# Patient Record
Sex: Female | Born: 1945 | Race: White | Hispanic: No | Smoking: Never smoker
Health system: Southern US, Community
[De-identification: ages and names within clinical notes are randomized; demographics above are authoritative.]

## PROBLEM LIST (undated history)

## (undated) DIAGNOSIS — N189 Chronic kidney disease, unspecified: Secondary | ICD-10-CM

## (undated) DIAGNOSIS — I10 Essential (primary) hypertension: Secondary | ICD-10-CM

## (undated) DIAGNOSIS — E785 Hyperlipidemia, unspecified: Secondary | ICD-10-CM

## (undated) HISTORY — DX: Chronic kidney disease, unspecified: N18.9

## (undated) HISTORY — DX: Hyperlipidemia, unspecified: E78.5

## (undated) HISTORY — DX: Essential (primary) hypertension: I10

---
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
## 1998-05-25 HISTORY — PX: CORONARY ARTERY BYPASS GRAFT: SHX141

## 2015-05-31 ENCOUNTER — Other Ambulatory Visit: Payer: Self-pay | Admitting: *Deleted

## 2015-05-31 ENCOUNTER — Encounter: Payer: Self-pay | Admitting: Vascular Surgery

## 2015-05-31 DIAGNOSIS — Z01810 Encounter for preprocedural cardiovascular examination: Secondary | ICD-10-CM

## 2015-05-31 DIAGNOSIS — N186 End stage renal disease: Secondary | ICD-10-CM

## 2015-06-21 ENCOUNTER — Ambulatory Visit: Payer: Self-pay | Admitting: Vascular Surgery

## 2015-06-21 ENCOUNTER — Other Ambulatory Visit (HOSPITAL_COMMUNITY): Payer: Self-pay

## 2015-06-21 ENCOUNTER — Encounter (HOSPITAL_COMMUNITY): Payer: Self-pay

## 2015-07-29 ENCOUNTER — Encounter: Payer: Self-pay | Admitting: Vascular Surgery

## 2015-08-02 ENCOUNTER — Encounter (HOSPITAL_COMMUNITY): Payer: Self-pay

## 2015-08-02 ENCOUNTER — Ambulatory Visit: Payer: Self-pay | Admitting: Vascular Surgery

## 2015-08-02 ENCOUNTER — Other Ambulatory Visit (HOSPITAL_COMMUNITY): Payer: Self-pay

## 2015-08-23 ENCOUNTER — Encounter: Payer: Self-pay | Admitting: Vascular Surgery

## 2015-08-26 ENCOUNTER — Other Ambulatory Visit (HOSPITAL_COMMUNITY): Payer: Self-pay

## 2015-08-26 ENCOUNTER — Inpatient Hospital Stay (HOSPITAL_COMMUNITY)
Admission: RE | Admit: 2015-08-26 | Payer: Self-pay | Source: Ambulatory Visit | Attending: Vascular Surgery | Admitting: Vascular Surgery

## 2015-08-30 ENCOUNTER — Ambulatory Visit: Payer: Self-pay | Admitting: Vascular Surgery

## 2015-09-17 ENCOUNTER — Encounter: Payer: Self-pay | Admitting: Vascular Surgery

## 2015-09-20 ENCOUNTER — Encounter: Payer: Self-pay | Admitting: Vascular Surgery

## 2015-09-20 ENCOUNTER — Ambulatory Visit (INDEPENDENT_AMBULATORY_CARE_PROVIDER_SITE_OTHER): Payer: 59 | Admitting: Vascular Surgery

## 2015-09-20 ENCOUNTER — Ambulatory Visit (HOSPITAL_COMMUNITY)
Admission: RE | Admit: 2015-09-20 | Discharge: 2015-09-20 | Disposition: A | Discharge status: Home / Self Care | Payer: Self-pay | Source: Ambulatory Visit | Attending: Vascular Surgery | Admitting: Vascular Surgery

## 2015-09-20 VITALS — BP 101/67 | HR 82 | Temp 97.4°F | Resp 20 | Ht 65.0 in | Wt 148.3 lb

## 2015-09-20 DIAGNOSIS — Z01810 Encounter for preprocedural cardiovascular examination: Secondary | ICD-10-CM

## 2015-09-20 DIAGNOSIS — Z01818 Encounter for other preprocedural examination: Secondary | ICD-10-CM | POA: Insufficient documentation

## 2015-09-20 DIAGNOSIS — Z992 Dependence on renal dialysis: Secondary | ICD-10-CM | POA: Diagnosis not present

## 2015-09-20 DIAGNOSIS — N186 End stage renal disease: Secondary | ICD-10-CM | POA: Diagnosis not present

## 2015-09-20 NOTE — Progress Notes (Signed)
Referred by:  Dr. Eliott Nineunham (Nephrology)  Reason for referral: permanent access   History of Present Illness  Kathryn Wilson is a 70 y.o. (1945-11-05) female who presents with chief complaint: bilateral leg "clots".  This patient was sent here for permanent access placement.  The patient REFUSES PERMANENT ACCESS placement, and would not continue the conversation.  Patient notes, worsening of swelling few weeks, associated with no obvious trigger.  She has had a longstanding history of leg swelling.  The patient's symptoms include: swelling and aching in legs.  The patient has had no history of DVT, no history of pregnancy, known history of varicose vein, no history of venous stasis ulcers, no history of  Lymphedema and no history of skin changes in lower legs.  There is no family history of venous disorders.  The patient has used compression stockings in the past but not consistently.   Past Medical History  Diagnosis Date  . Chronic kidney disease   . Hypertension   . Hyperlipidemia     Past Surgical History  Procedure Laterality Date  . Coronary artery bypass graft  2000    three vessel BP   . Tonsillectomy and adenoidectomy  1967    Social History   Social History  . Marital Status: Divorced    Spouse Name: N/A  . Number of Children: N/A  . Years of Education: N/A   Occupational History  . Not on file.   Social History Main Topics  . Smoking status: Never Smoker   . Smokeless tobacco: Not on file  . Alcohol Use: Not on file  . Drug Use: Not on file  . Sexual Activity: Not on file   Other Topics Concern  . Not on file   Social History Narrative    Family History  Problem Relation Age of Onset  . Heart attack Mother   . Kidney disease Father   . Heart attack Father     Current Outpatient Prescriptions  Medication Sig Dispense Refill  . amLODipine (NORVASC) 5 MG tablet Take 5 mg by mouth daily.    Marland Kitchen. aspirin 81 MG tablet Take 81 mg by mouth daily.    Marland Kitchen.  atorvastatin (LIPITOR) 40 MG tablet Take 40 mg by mouth daily.    Marland Kitchen. b complex-vitamin c-folic acid (NEPHRO-VITE) 0.8 MG TABS tablet Take 1 tablet by mouth at bedtime.    . carvedilol (COREG) 12.5 MG tablet Take 12.5 mg by mouth 2 (two) times daily with a meal.    . cinacalcet (SENSIPAR) 60 MG tablet Take 60 mg by mouth daily.    . folic acid-vitamin b complex-vitamin c-selenium-zinc (DIALYVITE) 3 MG TABS tablet Take 1 tablet by mouth daily.    . isosorbide mononitrate (IMDUR) 30 MG 24 hr tablet Take 30 mg by mouth daily.    Marland Kitchen. levothyroxine (SYNTHROID, LEVOTHROID) 88 MCG tablet Take 88 mcg by mouth daily before breakfast.    . lisinopril (PRINIVIL,ZESTRIL) 10 MG tablet Take 10 mg by mouth 2 (two) times daily.    Marland Kitchen. loperamide (IMODIUM) 2 MG capsule Take by mouth 4 (four) times daily as needed for diarrhea or loose stools.    . nitroGLYCERIN (NITROSTAT) 0.4 MG SL tablet Place 0.4 mg under the tongue every 5 (five) minutes as needed for chest pain.    Marland Kitchen. ondansetron (ZOFRAN) 4 MG tablet Take 4 mg by mouth every 8 (eight) hours as needed for nausea or vomiting. Take two (2) tablets every eight hours for nausea per notes  and medication lists from Dr Elza Rafter office.    . sevelamer carbonate (RENVELA) 800 MG tablet Take 800 mg by mouth 3 (three) times daily with meals. Take 3 (three) tablets three times a day per office notes from Dr Elza Rafter office     No current facility-administered medications for this visit.    Allergies  Allergen Reactions  . Calcium Acetate (Phos Binder) Other (See Comments)    Altered mental status  . Iodine   . Phenergan [Promethazine Hcl]   . Sulfa Antibiotics   . Tricor [Fenofibrate]      REVIEW OF SYSTEMS:  (Positives checked otherwise negative)  CARDIOVASCULAR:    chest pain,   chest pressure,   palpitations,   shortness of breath when laying flat,   shortness of breath with exertion,    pain in feet when walking,   pain in feet when  laying flat,  history of blood clot in veins (DVT),   history of phlebitis,   swelling in legs,   varicose veins  PULMONARY:    productive cough,   asthma,   wheezing  NEUROLOGIC:    weakness in arms or legs,   numbness in arms or legs,   difficulty speaking or slurred speech,   temporary loss of vision in one eye,   dizziness  HEMATOLOGIC:    bleeding problems,   problems with blood clotting too easily  MUSCULOSKEL:    joint pain,  joint swelling  GASTROINTEST:    vomiting blood,   blood in stool     GENITOURINARY:    burning with urination,   blood in urine  PSYCHIATRIC:    history of major depression  INTEGUMENTARY:    rashes,   ulcers  CONSTITUTIONAL:    fever,   chills   Physical Examination  Filed Vitals:   09/20/15 1047  BP: 101/67  Pulse: 82  Temp: 97.4 F (36.3 C)  TempSrc: Oral  Resp: 20  Height:  (1.651 m)  Weight: 148 lb 4.8 oz (67.268 kg)   Body mass index is 24.68 kg/(m^2).  General: A&O x 3, WDWN  Head: [place]/AT  Ear/Nose/Throat: Hearing grossly intact, nares without erythema or drainage, oropharynx without Erythema/Exudate, Mallampati score: 3  Eyes: PERRLA, EOMI  Neck: Supple, no nuchal rigidity, no palpable LAD  Pulmonary: Sym exp, good air movt, CTAB, no rales, rhonchi, & wheezing  Cardiac: RRR, Nl S1, S2, no Murmurs, rubs or gallops  Vascular: Vessel Right Left  Radial Palpable Palpable  Brachial Palpable Palpable  Carotid Palpable, without bruit Palpable, without bruit  Aorta Not palpable N/A  Femoral Palpable Palpable  Popliteal Not palpable Not palpable  PT Not Palpable Not Palpable  DP Faintly Palpable Faintly Palpable   Gastrointestinal: soft, NTND, no G/R, no HSM, no masses, no CVAT B  Musculoskeletal: M/S 5/5 throughout , Extremities without ischemic changes, B mild LDS, 1+ edema, palpable varicosities  Neurologic: CN 2-12  intact , Pain and light touch intact in extremities , Motor exam as listed above  Psychiatric: Judgment intact, Mood & affect appropriate for pt's clinical situation  Dermatologic: See M/S exam for extremity exam, no rashes otherwise noted  Lymph : No Cervical, Axillary, or Inguinal lymphadenopathy    Medical Decision Making  Kathryn Wilson is a 70 y.o. female who presents with: ESRD on HD,  BLE chronic venous insufficiency (C2), varicose veins with complications   I recommend having the patient come back for BLE venous reflux duplex.  Will discuss further the options once testing results are available.  In regards to her permanent access, pt can be referred again if she will consider such.  Thank you for allowing [place] to participate in this patient's care.   Leonides Sake, MD [facility] Office: [phone] Pager: [phone]  09/20/2015, 12:13 PM

## 2015-09-24 ENCOUNTER — Other Ambulatory Visit: Payer: Self-pay | Admitting: *Deleted

## 2015-09-24 DIAGNOSIS — I83893 Varicose veins of bilateral lower extremities with other complications: Secondary | ICD-10-CM

## 2015-10-04 ENCOUNTER — Encounter: Payer: Self-pay | Admitting: Vascular Surgery

## 2015-10-11 ENCOUNTER — Encounter: Payer: Self-pay | Admitting: Vascular Surgery

## 2015-10-11 ENCOUNTER — Ambulatory Visit (INDEPENDENT_AMBULATORY_CARE_PROVIDER_SITE_OTHER): Payer: 59 | Admitting: Vascular Surgery

## 2015-10-11 ENCOUNTER — Ambulatory Visit (HOSPITAL_COMMUNITY)
Admission: RE | Admit: 2015-10-11 | Discharge: 2015-10-11 | Disposition: A | Discharge status: Home / Self Care | Payer: Medicare Other | Source: Ambulatory Visit | Attending: Vascular Surgery | Admitting: Vascular Surgery

## 2015-10-11 VITALS — BP 115/66 | HR 57 | Ht 65.0 in | Wt 151.1 lb

## 2015-10-11 DIAGNOSIS — M7989 Other specified soft tissue disorders: Secondary | ICD-10-CM | POA: Diagnosis not present

## 2015-10-11 DIAGNOSIS — I83893 Varicose veins of bilateral lower extremities with other complications: Secondary | ICD-10-CM

## 2015-10-11 DIAGNOSIS — I83899 Varicose veins of unspecified lower extremities with other complications: Secondary | ICD-10-CM | POA: Insufficient documentation

## 2015-10-11 NOTE — Progress Notes (Signed)
Established Venous Insufficiency   History of Present Illness  Kathryn Wilson is a 70 y.o. (15-Jan-1946) female who presents with chief complaint: follow venous studies.  The patient's symptoms have not progressed.  The patient's symptoms are: bilateral leg swelling and aching in legs.  The patient is not using compression stockings.  The patient's PMH, PSH, SH, and FamHx are unchanged from 09/20/15.  Current Outpatient Prescriptions  Medication Sig Dispense Refill  . amLODipine (NORVASC) 5 MG tablet Take 5 mg by mouth daily.    Marland Kitchen aspirin 81 MG tablet Take 81 mg by mouth daily.    Marland Kitchen atorvastatin (LIPITOR) 40 MG tablet Take 40 mg by mouth daily.    Marland Kitchen b complex-vitamin c-folic acid (NEPHRO-VITE) 0.8 MG TABS tablet Take 1 tablet by mouth at bedtime.    . carvedilol (COREG) 12.5 MG tablet Take 12.5 mg by mouth 2 (two) times daily with a meal.    . cinacalcet (SENSIPAR) 60 MG tablet Take 60 mg by mouth daily.    . folic acid-vitamin b complex-vitamin c-selenium-zinc (DIALYVITE) 3 MG TABS tablet Take 1 tablet by mouth daily.    . isosorbide mononitrate (IMDUR) 30 MG 24 hr tablet Take 30 mg by mouth daily.    Marland Kitchen levothyroxine (SYNTHROID, LEVOTHROID) 88 MCG tablet Take 88 mcg by mouth daily before breakfast.    . lisinopril (PRINIVIL,ZESTRIL) 10 MG tablet Take 10 mg by mouth 2 (two) times daily.    Marland Kitchen loperamide (IMODIUM) 2 MG capsule Take by mouth 4 (four) times daily as needed for diarrhea or loose stools.    . nitroGLYCERIN (NITROSTAT) 0.4 MG SL tablet Place 0.4 mg under the tongue every 5 (five) minutes as needed for chest pain.    Marland Kitchen ondansetron (ZOFRAN) 4 MG tablet Take 4 mg by mouth every 8 (eight) hours as needed for nausea or vomiting. Take two (2) tablets every eight hours for nausea per notes and medication lists from Dr Elza Rafter office.    . sevelamer carbonate (RENVELA) 800 MG tablet Take 800 mg by mouth 3 (three) times daily with meals. Take 3 (three) tablets three times a day per office  notes from Dr Elza Rafter office     No current facility-administered medications for this visit.    Allergies  Allergen Reactions  . Calcium Acetate (Phos Binder) Other (See Comments)    Altered mental status  . Iodine   . Phenergan [Promethazine Hcl]   . Sulfa Antibiotics   . Tricor [Fenofibrate]     On ROS today: bilateral leg swelling, no recent change in medications    Physical Examination  Filed Vitals:   10/11/15 0937  BP: 115/66  Pulse: 57  Height:  (1.651 m)  Weight: 151 lb 1.6 oz (68.539 kg)  SpO2: 99%   Body mass index is 25.14 kg/(m^2).  General: A&O x 3, WDWN  Pulmonary: Sym exp, good air movt, CTAB, no rales, rhonchi, & wheezing  Cardiac: RRR, Nl S1, S2, no Murmurs, rubs or gallops  Vascular: Vessel Right Left  Radial Palpable Palpable  Brachial Palpable Palpable  Carotid Palpable, without bruit Palpable, without bruit  Aorta Not palpable N/A  Femoral Palpable Palpable  Popliteal Not palpable Not palpable  PT Not Palpable Not Palpable  DP Faintly Palpable Faintly Palpable   Gastrointestinal: soft, NTND, no G/R, no HSM, no masses, no CVAT B  Musculoskeletal: M/S 5/5 throughout , Extremities without ischemic changes, B mild LDS, 1+ edema, palpable varicosities  Neurologic: CN 2-12 intact ,  Pain and light touch intact in extremities , Motor exam as listed above   Non-Invasive Vascular Imaging  BLE Venous Insufficiency Duplex (Date: 10/11/2015):   RLE:   no DVT and SVT,   no GSV reflux,   no SSV reflux,  no deep venous reflux  LLE:  no DVT and SVT,   no GSV reflux,   no SSV reflux,  no deep venous reflux   Medical Decision Making  Kathryn Wilson is a 70 y.o. female who presents with: varicose veins, bilateral leg swelling of unknown etiology    While the patient has varicosities, her reflux studies was negative, suggesting intact venous valves.  Further evaluation for a systemic etiology of her  leg swelling is recommended.  Thank you for allowing us to participate in this patient's care.   Leonides SakeBrian Aaro Meyers, MD [facility] Office: [phone] Pager: [phone]  10/11/2015, 9:11 AM

## 2016-05-07 ENCOUNTER — Encounter (HOSPITAL_COMMUNITY): Admission: EM | Disposition: E | Payer: Self-pay | Source: Home / Self Care | Attending: Emergency Medicine

## 2016-05-07 ENCOUNTER — Emergency Department (HOSPITAL_COMMUNITY): Payer: Medicare Other

## 2016-05-07 ENCOUNTER — Emergency Department (HOSPITAL_COMMUNITY)
Admission: EM | Admit: 2016-05-07 | Discharge: 2016-05-25 | Disposition: E | Payer: Medicare Other | Attending: Emergency Medicine | Admitting: Emergency Medicine

## 2016-05-07 ENCOUNTER — Encounter (HOSPITAL_COMMUNITY): Payer: Self-pay | Admitting: *Deleted

## 2016-05-07 DIAGNOSIS — R5383 Other fatigue: Secondary | ICD-10-CM | POA: Diagnosis present

## 2016-05-07 DIAGNOSIS — Z7982 Long term (current) use of aspirin: Secondary | ICD-10-CM | POA: Diagnosis not present

## 2016-05-07 DIAGNOSIS — I120 Hypertensive chronic kidney disease with stage 5 chronic kidney disease or end stage renal disease: Secondary | ICD-10-CM | POA: Diagnosis not present

## 2016-05-07 DIAGNOSIS — R570 Cardiogenic shock: Secondary | ICD-10-CM | POA: Diagnosis not present

## 2016-05-07 DIAGNOSIS — Z951 Presence of aortocoronary bypass graft: Secondary | ICD-10-CM | POA: Insufficient documentation

## 2016-05-07 DIAGNOSIS — Z992 Dependence on renal dialysis: Secondary | ICD-10-CM | POA: Insufficient documentation

## 2016-05-07 DIAGNOSIS — N186 End stage renal disease: Secondary | ICD-10-CM | POA: Insufficient documentation

## 2016-05-07 DIAGNOSIS — Z79899 Other long term (current) drug therapy: Secondary | ICD-10-CM | POA: Insufficient documentation

## 2016-05-07 LAB — COMPREHENSIVE METABOLIC PANEL
ALBUMIN: 2.9 g/dL — AB (ref 3.5–5.0)
ALT: 16 U/L (ref 14–54)
ANION GAP: 15 (ref 5–15)
AST: 93 U/L — ABNORMAL HIGH (ref 15–41)
Alkaline Phosphatase: 57 U/L (ref 38–126)
BILIRUBIN TOTAL: 1.4 mg/dL — AB (ref 0.3–1.2)
BUN: 31 mg/dL — AB (ref 6–20)
CO2: 25 mmol/L (ref 22–32)
Calcium: 8.9 mg/dL (ref 8.9–10.3)
Chloride: 95 mmol/L — ABNORMAL LOW (ref 101–111)
Creatinine, Ser: 6.97 mg/dL — ABNORMAL HIGH (ref 0.44–1.00)
GFR calc Af Amer: 6 mL/min — ABNORMAL LOW (ref >60)
GFR calc non Af Amer: 5 mL/min — ABNORMAL LOW (ref >60)
GLUCOSE: 202 mg/dL — AB (ref 65–99)
POTASSIUM: 4.9 mmol/L (ref 3.5–5.1)
SODIUM: 135 mmol/L (ref 135–145)
Total Protein: 5.6 g/dL — ABNORMAL LOW (ref 6.5–8.1)

## 2016-05-07 LAB — CBC WITH DIFFERENTIAL/PLATELET
BASOS ABS: 0 10*3/uL (ref 0.0–0.1)
Basophils Relative: 0 %
EOS ABS: 0 10*3/uL (ref 0.0–0.7)
Eosinophils Relative: 0 %
HEMATOCRIT: 32.2 % — AB (ref 36.0–46.0)
Hemoglobin: 10.2 g/dL — ABNORMAL LOW (ref 12.0–15.0)
LYMPHS ABS: 0.8 10*3/uL (ref 0.7–4.0)
Lymphocytes Relative: 8 %
MCH: 31.7 pg (ref 26.0–34.0)
MCHC: 31.7 g/dL (ref 30.0–36.0)
MCV: 100 fL (ref 78.0–100.0)
MONOS PCT: 9 %
Monocytes Absolute: 0.9 10*3/uL (ref 0.1–1.0)
Neutro Abs: 8.3 10*3/uL — ABNORMAL HIGH (ref 1.7–7.7)
Neutrophils Relative %: 83 %
Platelets: 177 10*3/uL (ref 150–400)
RBC: 3.22 MIL/uL — AB (ref 3.87–5.11)
RDW: 14.9 % (ref 11.5–15.5)
WBC: 10 10*3/uL (ref 4.0–10.5)

## 2016-05-07 LAB — I-STAT CHEM 8, ED
BUN: 35 mg/dL — AB (ref 6–20)
BUN: 37 mg/dL — ABNORMAL HIGH (ref 6–20)
CALCIUM ION: 0.96 mmol/L — AB (ref 1.15–1.40)
CHLORIDE: 101 mmol/L (ref 101–111)
CHLORIDE: 101 mmol/L (ref 101–111)
CREATININE: 7.1 mg/dL — AB (ref 0.44–1.00)
Calcium, Ion: 1.08 mmol/L — ABNORMAL LOW (ref 1.15–1.40)
Creatinine, Ser: 7.2 mg/dL — ABNORMAL HIGH (ref 0.44–1.00)
GLUCOSE: 190 mg/dL — AB (ref 65–99)
Glucose, Bld: 199 mg/dL — ABNORMAL HIGH (ref 65–99)
HCT: 29 % — ABNORMAL LOW (ref 36.0–46.0)
HCT: 33 % — ABNORMAL LOW (ref 36.0–46.0)
HEMOGLOBIN: 9.9 g/dL — AB (ref 12.0–15.0)
Hemoglobin: 11.2 g/dL — ABNORMAL LOW (ref 12.0–15.0)
POTASSIUM: 4.6 mmol/L (ref 3.5–5.1)
POTASSIUM: 5.2 mmol/L — AB (ref 3.5–5.1)
SODIUM: 134 mmol/L — AB (ref 135–145)
Sodium: 134 mmol/L — ABNORMAL LOW (ref 135–145)
TCO2: 23 mmol/L (ref 0–100)
TCO2: 26 mmol/L (ref 0–100)

## 2016-05-07 LAB — I-STAT TROPONIN, ED: Troponin i, poc: 22.46 ng/mL (ref 0.00–0.08)

## 2016-05-07 LAB — I-STAT CG4 LACTIC ACID, ED: LACTIC ACID, VENOUS: 4.34 mmol/L — AB (ref 0.5–1.9)

## 2016-05-07 SURGERY — INVASIVE LAB ABORTED CASE

## 2016-05-07 MED ORDER — ASPIRIN 300 MG RE SUPP
300.0000 mg | Freq: Once | RECTAL | Status: AC
  Administered 2016-05-07: 300 mg via RECTAL

## 2016-05-07 MED ORDER — SODIUM BICARBONATE 8.4 % IV SOLN
INTRAVENOUS | Status: AC
  Filled 2016-05-07: qty 50

## 2016-05-07 MED ORDER — PHENYLEPHRINE 40 MCG/ML (10ML) SYRINGE FOR IV PUSH (FOR BLOOD PRESSURE SUPPORT)
PREFILLED_SYRINGE | INTRAVENOUS | Status: AC
  Administered 2016-05-07: 200 ug
  Administered 2016-05-07: 100 ug
  Filled 2016-05-07: qty 10

## 2016-05-07 MED ORDER — SODIUM BICARBONATE 8.4 % IV SOLN: 50.0000 meq | Freq: Once | INTRAVENOUS | Status: DC

## 2016-05-07 MED ORDER — CALCIUM GLUCONATE 10 % IV SOLN
INTRAVENOUS | Status: AC
  Administered 2016-05-07: 1000 mg
  Filled 2016-05-07: qty 10

## 2016-05-07 MED ORDER — SODIUM CHLORIDE 0.9 % IV BOLUS (SEPSIS): 1000.0000 mL | Freq: Once | INTRAVENOUS | Status: DC

## 2016-05-07 MED ORDER — SODIUM CHLORIDE 0.9 % IV SOLN
0.0300 [IU]/min | INTRAVENOUS | Status: DC
  Filled 2016-05-07: qty 2

## 2016-05-07 MED ORDER — VANCOMYCIN HCL IN DEXTROSE 1-5 GM/200ML-% IV SOLN
1000.0000 mg | Freq: Once | INTRAVENOUS | Status: AC
  Administered 2016-05-07: 1000 mg via INTRAVENOUS
  Filled 2016-05-07: qty 200

## 2016-05-07 MED ORDER — PIPERACILLIN-TAZOBACTAM 3.375 G IVPB 30 MIN
3.3750 g | Freq: Once | INTRAVENOUS | Status: DC
  Filled 2016-05-07: qty 50

## 2016-05-07 MED ORDER — ETOMIDATE 2 MG/ML IV SOLN
INTRAVENOUS | Status: AC | PRN
  Administered 2016-05-07: 20 mg via INTRAVENOUS

## 2016-05-07 MED ORDER — DEXTROSE 5 % IV SOLN
30.0000 ug/min | INTRAVENOUS | Status: DC
  Administered 2016-05-07: 100 ug/min via INTRAVENOUS
  Filled 2016-05-07: qty 1

## 2016-05-07 MED ORDER — SODIUM CHLORIDE 0.9 % IV BOLUS (SEPSIS): 250.0000 mL | Freq: Once | INTRAVENOUS | Status: DC

## 2016-05-07 MED ORDER — SODIUM CHLORIDE 0.9 % IV BOLUS (SEPSIS)
1000.0000 mL | Freq: Once | INTRAVENOUS | Status: AC
  Administered 2016-05-07: 1000 mL via INTRAVENOUS

## 2016-05-07 MED ORDER — NOREPINEPHRINE BITARTRATE 1 MG/ML IV SOLN
0.0000 ug/min | Freq: Once | INTRAVENOUS | Status: AC
  Administered 2016-05-07: 6 ug/min via INTRAVENOUS
  Filled 2016-05-07: qty 4

## 2016-05-07 MED ORDER — EPINEPHRINE PF 1 MG/10ML IJ SOSY
PREFILLED_SYRINGE | INTRAMUSCULAR | Status: AC | PRN
  Administered 2016-05-07 (×5): 1 mg via INTRAVENOUS

## 2016-05-07 MED ORDER — ROCURONIUM BROMIDE 50 MG/5ML IV SOLN
INTRAVENOUS | Status: AC | PRN
  Administered 2016-05-07: 100 mg via INTRAVENOUS

## 2016-05-07 MED ORDER — SODIUM BICARBONATE 8.4 % IV SOLN
INTRAVENOUS | Status: AC | PRN
  Administered 2016-05-07: 50 meq via INTRAVENOUS

## 2016-05-07 MED FILL — Medication: Qty: 1 | Status: AC

## 2016-05-07 SURGICAL SUPPLY — 9 items
CATH INFINITI 5FR MULTPACK ANG (CATHETERS) IMPLANT
KIT ENCORE 26 ADVANTAGE (KITS) IMPLANT
KIT HEART LEFT (KITS) IMPLANT
PACK CARDIAC CATHETERIZATION (CUSTOM PROCEDURE TRAY) IMPLANT
SHEATH PINNACLE 6F 10CM (SHEATH) IMPLANT
SYR MEDRAD MARK V 150ML (SYRINGE) IMPLANT
TRANSDUCER W/STOPCOCK (MISCELLANEOUS) IMPLANT
TUBING CIL FLEX 10 FLL-RA (TUBING) IMPLANT
WIRE EMERALD 3MM-J .035X150CM (WIRE) IMPLANT

## 2016-05-12 LAB — CULTURE, BLOOD (ROUTINE X 2)
Culture: NO GROWTH
Culture: NO GROWTH

## 2016-05-25 NOTE — Progress Notes (Signed)
Pt was intubated and briefly placed on vent before code was called. RT began to bag pt and ACLS was performed until time of death. RT left ETT in place in case it becomes ME case if not RT will remove when order is given.

## 2016-05-25 NOTE — ED Notes (Signed)
Son at bedside.  Will escort to the family room and call chaplin

## 2016-05-25 NOTE — ED Provider Notes (Signed)
[facility]-EMERGENCY DEPT Provider Note   CSN: [id number] Arrival date & time: 05/22/2016  4098   By signing my name below, I, Clovis Pu, attest that this documentation has been prepared under the direction and in the presence of Shon Baton, MD  Electronically Signed: Clovis Pu, ED Scribe. May 22, 2016. 3:57 AM.   History   Chief Complaint Chief Complaint  Patient presents with  . Fatigue   The history is provided by the patient. No language interpreter was used.   HPI Comments:  Kathryn Wilson is a 71 y.o. female, with a hx of 4 MIs, HTN, hyperlipidemia, and a hernia, who presents to the Emergency Department complaining of persistent fatigue x several days. Pt notes a change in appetite, change in fluid intake, dizziness, weakness, nausea and abdominal pain (only upon palpation). She is on multiple blood pressure medications and states she does not take them as instructed. Pt denies chest pain, SOB, cough, vomiting, diarrhea, any urinary symptoms, suicidal ideations, any other associated symptoms and modifying factors at this time. Pt is a dialysis pt and states she was last dialyzed on 05/05/16.   Past Medical History:  Diagnosis Date  . Chronic kidney disease   . Hyperlipidemia   . Hypertension     Patient Active Problem List   Diagnosis Date Noted  . Leg swelling 10/11/2015  . 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 ESRD on dialysis (HCC) 09/20/2015  . Preop examination 09/20/2015    Past Surgical History:  Procedure Laterality Date  . CORONARY ARTERY BYPASS GRAFT  2000   three vessel BP   . TONSILLECTOMY AND ADENOIDECTOMY  1967    OB History    No data available       Home Medications    Prior to Admission medications   Medication Sig Start Date End Date Taking? Authorizing Provider  amLODipine (NORVASC) 5 MG tablet Take 5 mg by mouth daily.    Historical Provider, MD  aspirin 81 MG tablet Take 81 mg by mouth daily.     Historical Provider, MD  atorvastatin (LIPITOR) 40 MG tablet Take 40 mg by mouth daily.    Historical Provider, MD  b complex-vitamin c-folic acid (NEPHRO-VITE) 0.8 MG TABS tablet Take 1 tablet by mouth at bedtime.    Historical Provider, MD  carvedilol (COREG) 12.5 MG tablet Take 12.5 mg by mouth 2 (two) times daily with a meal.    Historical Provider, MD  cinacalcet (SENSIPAR) 60 MG tablet Take 60 mg by mouth daily.    Historical Provider, MD  folic acid-vitamin b complex-vitamin c-selenium-zinc (DIALYVITE) 3 MG TABS tablet Take 1 tablet by mouth daily.    Historical Provider, MD  isosorbide mononitrate (IMDUR) 30 MG 24 hr tablet Take 30 mg by mouth daily.    Historical Provider, MD  levothyroxine (SYNTHROID, LEVOTHROID) 88 MCG tablet Take 88 mcg by mouth daily before breakfast.    Historical Provider, MD  lisinopril (PRINIVIL,ZESTRIL) 10 MG tablet Take 10 mg by mouth 2 (two) times daily.    Historical Provider, MD  loperamide (IMODIUM) 2 MG capsule Take by mouth 4 (four) times daily as needed for diarrhea or loose stools.    Historical Provider, MD  nitroGLYCERIN (NITROSTAT) 0.4 MG SL tablet Place 0.4 mg under the tongue every 5 (five) minutes as needed for chest pain.    Historical Provider, MD  ondansetron (ZOFRAN) 4 MG tablet Take 4 mg by mouth every 8 (eight) hours as needed for nausea or vomiting.  Take two (2) tablets every eight hours for nausea per notes and medication lists from Dr Elza Rafter office.    Historical Provider, MD  sevelamer carbonate (RENVELA) 800 MG tablet Take 800 mg by mouth 3 (three) times daily with meals. Take 3 (three) tablets three times a day per office notes from Dr Elza Rafter office    Historical Provider, MD    Family History Family History  Problem Relation Age of Onset  . Heart attack Mother   . Kidney disease Father   . Heart attack Father     Social History Social History  Substance Use Topics  . Smoking status: Never Smoker  . Smokeless tobacco: Never  Used  . Alcohol use No     Allergies   Calcium acetate (phos binder); Iodine; Phenergan [promethazine hcl]; Sulfa antibiotics; and Tricor [fenofibrate]   Review of Systems Review of Systems  Constitutional: Positive for appetite change and fatigue. Negative for fever.  Respiratory: Negative for cough and shortness of breath.   Cardiovascular: Negative for chest pain.  Gastrointestinal: Positive for nausea. Negative for abdominal pain, diarrhea and vomiting.  Genitourinary: Negative for dysuria.  Neurological: Positive for dizziness and weakness.  Psychiatric/Behavioral: Negative for suicidal ideas.  All other systems reviewed and are negative.  Physical Exam Updated Vital Signs BP (!) 77/26   Pulse (!) 50   Temp 99.4 F (37.4 C) (Rectal)   Resp 15   Wt 151 lb (68.5 kg)   SpO2 (!) 86%   BMI 25.13 kg/m   Physical Exam  Constitutional: She is oriented to person, place, and time.  Ill-appearing  HENT:  Head: Normocephalic and atraumatic.  Neck: Neck supple.  Cardiovascular: Regular rhythm and normal heart sounds.   Tachycardia, no palpable peripheral pulses, very faint central pulses  Pulmonary/Chest: Effort normal. No respiratory distress. She has no wheezes.  Abdominal: Soft. Bowel sounds are normal. She exhibits no mass. There is tenderness. There is no guarding.  Reducible periumbilical hernia, no overlying skin changes  Neurological: She is oriented to person, place, and time.  Somnolent but arousable  Skin: Skin is warm.  diaphoretic  Psychiatric:  Flat affect  Nursing note and vitals reviewed.    ED Treatments / Results  DIAGNOSTIC STUDIES:  Oxygen Saturation is 99% on RA, normal by my interpretation.    COORDINATION OF CARE:  3:53 AM Discussed treatment plan with pt at bedside and pt agreed to plan.  Labs (all labs ordered are listed, but only abnormal results are displayed) Labs Reviewed  CBC WITH DIFFERENTIAL/PLATELET - Abnormal; Notable for the  following:       Result Value   RBC 3.22 (*)    Hemoglobin 10.2 (*)    HCT 32.2 (*)    Neutro Abs 8.3 (*)    All other components within normal limits  COMPREHENSIVE METABOLIC PANEL - Abnormal; Notable for the following:    Chloride 95 (*)    Glucose, Bld 202 (*)    BUN 31 (*)    Creatinine, Ser 6.97 (*)    Total Protein 5.6 (*)    Albumin 2.9 (*)    AST 93 (*)    Total Bilirubin 1.4 (*)    GFR calc non Af Amer 5 (*)    GFR calc Af Amer 6 (*)    All other components within normal limits  I-STAT CHEM 8, ED - Abnormal; Notable for the following:    Sodium 134 (*)    Potassium 5.2 (*)    BUN  35 (*)    Creatinine, Ser 7.10 (*)    Glucose, Bld 190 (*)    Calcium, Ion 0.96 (*)    Hemoglobin 11.2 (*)    HCT 33.0 (*)    All other components within normal limits  I-STAT TROPOININ, ED - Abnormal; Notable for the following:    Troponin i, poc 22.46 (*)    All other components within normal limits  I-STAT CG4 LACTIC ACID, ED - Abnormal; Notable for the following:    Lactic Acid, Venous 4.34 (*)    All other components within normal limits  I-STAT CHEM 8, ED - Abnormal; Notable for the following:    Sodium 134 (*)    BUN 37 (*)    Creatinine, Ser 7.20 (*)    Glucose, Bld 199 (*)    Calcium, Ion 1.08 (*)    Hemoglobin 9.9 (*)    HCT 29.0 (*)    All other components within normal limits  CULTURE, BLOOD (ROUTINE X 2)  CULTURE, BLOOD (ROUTINE X 2)  URINE CULTURE  URINALYSIS, ROUTINE W REFLEX MICROSCOPIC    EKG  EKG Interpretation  Date/Time:  Thursday May 07 2016 03:40:11 EST Ventricular Rate:  101 PR Interval:    QRS Duration: 166 QT Interval:  422 QTC Calculation: 548 R Axis:   -106 Text Interpretation:  Sinus or ectopic atrial tachycardia Right bundle branch block Consider left ventricular hypertrophy No prior comparison Confirmed by Churchill Grimsley  MD, Yazan Gatling ([id number]) on 2016-01-20 3:43:11 AM       EKG Interpretation  Date/Time:  Thursday May 07 2016 03:57:37  EST Ventricular Rate:  97 PR Interval:    QRS Duration: 200 QT Interval:  455 QTC Calculation: 579 R Axis:   -132 Text Interpretation:  Accelerated junctional rhythm Right bundle branch block ST depression, consider ischemia, lateral lds Confirmed by Wilkie AyeHORTON  MD, Tequila Rottmann ([id number]) on 2016-01-20 6:33:24 AM        Radiology Dg Chest Portable 1 View  Result Date: 2016-01-20 CLINICAL DATA:  [age] year old female with central line placement. EXAM: PORTABLE CHEST 1 VIEW COMPARISON:  Chest radiograph dated 2016-01-20 FINDINGS: There has been interval placement of a right IJ central line with tip over the right atrium. Left-sided dialysis catheter remains in stable position. There is a patchy area opacity in the right perihilar region with air bronchogram which may represent atelectasis versus infiltrate. A hilar mass is not excluded. Clinical correlation and follow-up to resolution is recommended. Left lung base atelectatic changes versus less likely infiltrate. Linear left mid lung field platelike atelectasis/scarring. There is no pleural effusion or pneumothorax. There is moderate cardiomegaly. Median sternotomy wires and CABG vascular clips noted. No acute osseous pathology. IMPRESSION: Interval placement of a right IJ central line with tip over right atrium. No pneumothorax. Right hilar opacity may represent atelectasis versus infiltrate. A hilar mass is not excluded. Clinical correlation and follow-up to resolution is recommended. Left lung base atelectatic changes. Electronically Signed   By: Elgie CollardArash  Radparvar M.D.   On: 2016-01-20 05:38   Dg Chest Portable 1 View  Result Date: 2016-01-20 CLINICAL DATA:  71 y/o  F; low blood pressure and fatigue. EXAM: PORTABLE CHEST 1 VIEW COMPARISON:  None. FINDINGS: Mild cardiomegaly. Median sternotomy changes with intact wires. Left central venous catheter tip projects over the right atrium. Linear opacities in left mid and lower lung zone likely represent  atelectasis. No focal consolidation. No acute osseous abnormality is evident. IMPRESSION: Mild cardiomegaly. Left mid and lower lung zone minor atelectasis.  No focal consolidation. Electronically Signed   By: Mitzi HansenLance  Furusawa-Stratton M.D.   On: December 26, 2015 04:40    Procedures Procedures (including critical care time)  INTUBATION Performed by: Shon BatonHORTON, Mossie Gilder F  Required items: required blood products, implants, devices, and special equipment available Patient identity confirmed: provided demographic data and hospital-assigned identification number Time out: Immediately prior to procedure a "time out" was called to verify the correct patient, procedure, equipment, support staff and site/side marked as required.  Indications: shock  Intubation method: Glidescope Laryngoscopy   Preoxygenation: BVM  Sedatives: Etomidate Paralytic: rOCURONIUM  Tube Size: 7.5 cuffed  Post-procedure assessment: chest rise and ETCO2 monitor Breath sounds: equal and absent over the epigastrium Tube secured with: ETT holder Chest x-ray interpreted by radiologist and me.  Chest x-ray findings: endotracheal tube in appropriate position  Patient tolerated the procedure well with no immediate complications.  Cardiopulmonary Resuscitation (CPR) Procedure Note Directed/Performed by: Shon BatonHORTON, Emmit Oriley F I personally directed ancillary staff and/or performed CPR in an effort to regain return of spontaneous circulation and to maintain cardiac, neuro and systemic perfusion.   CENTRAL LINE Performed by: Ross MarcusHORTON, Jeromy Borcherding F Consent: The procedure was performed in an emergent situation. Required items: required blood products, implants, devices, and special equipment available Patient identity confirmed: arm band and provided demographic data Time out: Immediately prior to procedure a "time out" was called to verify the correct patient, procedure, equipment, support staff and site/side marked as  required. Indications: vascular access Anesthesia: local infiltration Local anesthetic: lidocaine 1% with epinephrine Anesthetic total: 3 ml Patient sedated: no Preparation: skin prepped with 2% chlorhexidine Skin prep agent dried: skin prep agent completely dried prior to procedure Sterile barriers: all five maximum sterile barriers used - cap, mask, sterile gown, sterile gloves, and large sterile sheet Hand hygiene: hand hygiene performed prior to central venous catheter insertion  Location details: Right IJ  Catheter type: triple lumen Catheter size: 8 Fr Pre-procedure: landmarks identified Ultrasound guidance: yes Successful placement: yes Post-procedure: line sutured and dressing applied Assessment: blood return through all parts, free fluid flow, placement verified by x-ray and no pneumothorax on x-ray Patient tolerance: Patient tolerated the procedure well with no immediate complications.  CRITICAL CARE Performed by: Shon BatonHORTON, Eder Macek F   Total critical care time: 120 minutes  Critical care time was exclusive of separately billable procedures and treating other patients.  Critical care was necessary to treat or prevent imminent or life-threatening deterioration.  Critical care was time spent personally by me on the following activities: development of treatment plan with patient and/or surrogate as well as nursing, discussions with consultants, evaluation of patient's response to treatment, examination of patient, obtaining history from patient or surrogate, ordering and performing treatments and interventions, ordering and review of laboratory studies, ordering and review of radiographic studies, pulse oximetry and re-evaluation of patient's condition.    EMERGENCY DEPARTMENT US CARDIAC EXAM "Study: Limited Ultrasound of the heart and pericardium"  INDICATIONS:Hypotension Multiple views of the heart and pericardium are obtained with a multi-frequency probe.  PERFORMED  HQ:IONGEXBY:Myself  IMAGES ARCHIVED?: Yes  FINDINGS: Decreased contractility, IVC normal and Tamponade physiology absent  LIMITATIONS:  Emergent procedure  VIEWS USED: Parasternal long axis, Parasternal short axis, Apical 4 chamber  and Inferior Vena Cava  INTERPRETATION: Volume status normal and Decreased contractility  COMMENT:  Global hypokinesis Medications Ordered in ED Medications  sodium chloride 0.9 % bolus 1,000 mL (1,000 mLs Intravenous Not Given 02/24/2016 0504)    And  sodium chloride 0.9 % bolus 1,000  mL (1,000 mLs Intravenous New Bag/Given 2016-05-23 0426)    And  sodium chloride 0.9 % bolus 250 mL (250 mLs Intravenous Not Given May 23, 2016 0503)  vancomycin (VANCOCIN) IVPB 1000 mg/200 mL premix (1,000 mg Intravenous New Bag/Given May 23, 2016 0517)  piperacillin-tazobactam (ZOSYN) IVPB 3.375 g (not administered)  vasopressin (PITRESSIN) 40 Units in sodium chloride 0.9 % 250 mL (0.16 Units/mL) infusion (not administered)  sodium bicarbonate injection 50 mEq (not administered)  phenylephrine (NEO-SYNEPHRINE) 10 mg in dextrose 5 % 250 mL (0.04 mg/mL) infusion (100 mcg/min Intravenous New Bag/Given 2016-05-23 0544)  EPINEPHrine (ADRENALIN) 1 MG/10ML injection (1 mg Intravenous Given 2016/05/23 0558)  etomidate (AMIDATE) injection (20 mg Intravenous Given 2016-05-23 0539)  rocuronium (ZEMURON) injection (100 mg Intravenous Given 2016-05-23 0540)  sodium bicarbonate injection (50 mEq Intravenous Given 2016-05-23 0549)  sodium chloride 0.9 % bolus 1,000 mL (0 mLs Intravenous Stopped 23-May-2016 0445)  calcium gluconate 10 % injection (1,000 mg  Given 2016-05-23 0405)  norepinephrine (LEVOPHED) 4 mg in dextrose 5 % 250 mL (0.016 mg/mL) infusion (50 mcg/min Intravenous Rate/Dose Change 23-May-2016 0557)  aspirin suppository 300 mg (300 mg Rectal Given May 23, 2016 0444)  phenylephrine 0.4-0.9 MG/10ML-% injection (100 mcg  Given 05/23/2016 0503)     Initial Impression / Assessment and Plan / ED Course  I have  reviewed the triage vital signs and the nursing notes.  Pertinent labs & imaging results that were available during my care of the patient were reviewed by me and considered in my medical decision making (see chart for details).  Clinical Course     Patient presents with generalized fatigue. Notably hypotensive upon arrival. EMS unable to obtain a manual blood pressure. Denies any pain at this time. She does report nausea. Notably diaphoretic. Has a history of coronary artery disease. EKG with a right bundle branch block with no prior for comparison. I have looked at care everywhere and there is no documentation of a prior right bundle branch block. She does have a known reduced EF and prior stenting and CABG. Given wide-complex rhythm, patient was given calcium gluconate initially. Potassium 5.2. Undifferentiated shock at this time. She does not appear to be responding to appropriate fluid resuscitation. Lactate is 4. Initial lab work obtained. Patient given 30 mL/kg and broad-spectrum antibiotics. Initially, patient did not want to be intubated or receive CPR; however, upon arrival of her son, patient states "do everything." Troponin 22. Cardiology and critical care emergently consulted.  Dr. Herbie Baltimore as well as the cardiology fellow at the bedside to evaluate the patient. I placed an emergent catheter and started Levaquin as well as phenylephrine. Patient had persistently low blood pressures with poor perfusion.  Given EKG and troponin, presumed cardiogenic shock. Plan to take patient to the Cath Lab. However, she remained very unstable. She was intubated. Postintubation she arrested. Initially PEA.  She then received 2 shocks for V. tach. She was coded for approximately 20 minutes. Son was updated.  Given poor prognosis and patient's initial wishes regarding advanced life support, CPR was stopped. Time of death 0600.  Final Clinical Impressions(s) / ED Diagnoses   Final diagnoses:  Cardiogenic shock  (HCC)    New Prescriptions New Prescriptions   No medications on file   I personally performed the services described in this documentation, which was scribed in my presence. The recorded information has been reviewed and is accurate.     Shon Baton, MD 05/23/2016 [phone]

## 2016-05-25 NOTE — ED Notes (Signed)
Dr Wilkie AyeHorton aware of heart rate of 55.

## 2016-05-25 NOTE — ED Notes (Signed)
No pulse.  CPR resumed.   

## 2016-05-25 NOTE — ED Notes (Signed)
Dr Wilkie AyeHorton confirmed placement via xray

## 2016-05-25 NOTE — ED Notes (Signed)
Faint femoral pulse noted by Dr. Wilkie AyeHorton.  CPR stopped

## 2016-05-25 NOTE — ED Notes (Signed)
Will infuse a total of 2200 ml NS and stop the fluids due to Trop 22

## 2016-05-25 NOTE — ED Notes (Signed)
CPR stopped pulse check done no pulses noted  Time of death noted.  Family has been at the bedside for the last 7 mins.

## 2016-05-25 NOTE — ED Notes (Signed)
Levo moved to the Medial port after flushing and checking for blood return

## 2016-05-25 NOTE — Progress Notes (Signed)
PULMONARY / CRITICAL CARE MEDICINE   Name: Kathryn Wilson MRN: [id number] DOB: [birth year]    ADMISSION DATE:  2015-09-29 CONSULTATION DATE:  2015-09-29  REFERRING MD:  Dr. Wilkie AyeHorton EDP  CHIEF COMPLAINT:  fatigue  HISTORY OF PRESENT ILLNESS:  Patient is encephalopathic and/or intubated. Therefore history has been obtained from chart review. 71 year old female with PMH as below, which is significant for CAD with 4 MI, HTN, HLD, DM, sCHF  (most recent EF 35%) ,ESRD on HD (last HD 12/12) in setting of polycystic kidney disease, hypothyroid,  CVA, [facility]14 early AM with complaints of generalized weakness for several days. Also complaining of poor PO intake, dizziness, weakness, and nausea. Upon presentation to the ED she was found to be profoundly hypotensive with SBP in 60s. This was refractory to 2.5 L of IVF resuscitation and thus she was started on Levophed. Laboratory evaluation significant for SCr 7.20,  Glucose 199, Troponin 22, Lactic acid 4.34, hemoglobin 9.9. CVL placed by EDP for due to poor access and pressor demands. Cardiology consulted for elevated troponin PCCM to admit.  PAST MEDICAL HISTORY :  She  has a past medical history of Chronic kidney disease; Hyperlipidemia; and Hypertension.  PAST SURGICAL HISTORY: She  has a past surgical history that includes Coronary artery bypass graft (2000) and Tonsillectomy and adenoidectomy (1967).  Allergies  Allergen Reactions  . Calcium Acetate (Phos Binder) Other (See Comments)    Altered mental status  . Iodine   . Phenergan [Promethazine Hcl]   . Sulfa Antibiotics   . Tricor [Fenofibrate]     No current facility-administered medications on file prior to encounter.    Current Outpatient Prescriptions on File Prior to Encounter  Medication Sig  . amLODipine (NORVASC) 5 MG tablet Take 5 mg by mouth daily.  Marland Kitchen. aspirin 81 MG tablet Take 81 mg by mouth daily.  Marland Kitchen. atorvastatin (LIPITOR) 40 MG tablet Take 40 mg by mouth  daily.  Marland Kitchen. b complex-vitamin c-folic acid (NEPHRO-VITE) 0.8 MG TABS tablet Take 1 tablet by mouth at bedtime.  . carvedilol (COREG) 12.5 MG tablet Take 12.5 mg by mouth 2 (two) times daily with a meal.  . cinacalcet (SENSIPAR) 60 MG tablet Take 60 mg by mouth daily.  . folic acid-vitamin b complex-vitamin c-selenium-zinc (DIALYVITE) 3 MG TABS tablet Take 1 tablet by mouth daily.  . isosorbide mononitrate (IMDUR) 30 MG 24 hr tablet Take 30 mg by mouth daily.  Marland Kitchen. levothyroxine (SYNTHROID, LEVOTHROID) 88 MCG tablet Take 88 mcg by mouth daily before breakfast.  . lisinopril (PRINIVIL,ZESTRIL) 10 MG tablet Take 10 mg by mouth 2 (two) times daily.  Marland Kitchen. loperamide (IMODIUM) 2 MG capsule Take by mouth 4 (four) times daily as needed for diarrhea or loose stools.  . nitroGLYCERIN (NITROSTAT) 0.4 MG SL tablet Place 0.4 mg under the tongue every 5 (five) minutes as needed for chest pain.  Marland Kitchen. ondansetron (ZOFRAN) 4 MG tablet Take 4 mg by mouth every 8 (eight) hours as needed for nausea or vomiting. Take two (2) tablets every eight hours for nausea per notes and medication lists from Dr Elza Rafterunham's office.  . sevelamer carbonate (RENVELA) 800 MG tablet Take 800 mg by mouth 3 (three) times daily with meals. Take 3 (three) tablets three times a day per office notes from Dr Elza Rafterunham's office    FAMILY HISTORY:  Her indicated that her mother is deceased. She indicated that her father is deceased.    SOCIAL HISTORY: She  reports that  she has never smoked. She has never used smokeless tobacco. She reports that she does not drink alcohol or use drugs.   SUBJECTIVE:    VITAL SIGNS: BP (!) 77/26   Pulse (!) 50   Temp 99.4 F (37.4 C) (Rectal)   Resp 15   Wt 68.5 kg (151 lb)   SpO2 (!) 86%   BMI 25.13 kg/m   HEMODYNAMICS:    VENTILATOR SETTINGS:    INTAKE / OUTPUT: No intake/output data recorded.  PHYSICAL EXAMINATION:   LABS:  BMET  Recent Labs Lab 10-07-15 0406 10-07-15 0410 10-07-15 0422   NA 134* 135 134*  K 5.2* 4.9 4.6  CL 101 95* 101  CO2  --  25  --   BUN 35* 31* 37*  CREATININE 7.10* 6.97* 7.20*  GLUCOSE 190* 202* 199*    Electrolytes  Recent Labs Lab 10-07-15 0410  CALCIUM 8.9    CBC  Recent Labs Lab 10-07-15 0406 10-07-15 0410 10-07-15 0422  WBC  --  10.0  --   HGB 11.2* 10.2* 9.9*  HCT 33.0* 32.2* 29.0*  PLT  --  177  --     Coag's No results for input(s): APTT, INR in the last 168 hours.  Sepsis Markers  Recent Labs Lab 10-07-15 0423  LATICACIDVEN 4.34*    ABG No results for input(s): PHART, PCO2ART, PO2ART in the last 168 hours.  Liver Enzymes  Recent Labs Lab 10-07-15 0410  AST 93*  ALT 16  ALKPHOS 57  BILITOT 1.4*  ALBUMIN 2.9*    Cardiac Enzymes No results for input(s): TROPONINI, PROBNP in the last 168 hours.  Glucose No results for input(s): GLUCAP in the last 168 hours.  Imaging Dg Chest Portable 1 View  Result Date: 18-Aug-2015 CLINICAL DATA:  71 y/o  F; low blood pressure and fatigue. EXAM: PORTABLE CHEST 1 VIEW COMPARISON:  None. FINDINGS: Mild cardiomegaly. Median sternotomy changes with intact wires. Left central venous catheter tip projects over the right atrium. Linear opacities in left mid and lower lung zone likely represent atelectasis. No focal consolidation. No acute osseous abnormality is evident. IMPRESSION: Mild cardiomegaly. Left mid and lower lung zone minor atelectasis. No focal consolidation. Electronically Signed   By: Mitzi HansenLance  Furusawa-Stratton M.D.   On: 18-Aug-2015 04:40    ASSESSMENT / PLAN:  Patient in shock, likely cardiogenic in setting of MI with troponin 22 and wide complex on ECG. Upon my arrival she was requiring multiple pressors and remained hypotensive. She was to go for cath, however, she was unstable and required intubation. After intubation he suffered bradycardic arrest into PEA. Underwent several rounds of ACLS including defibrillation for VT x2. Despite these aggressive  therapies we were unable to regain pulses. See code documentation for further detail.   Joneen RoachPaul Aisley Whan, AGACNP-BC [facility] Pager [phone] or [phone]) [phone]  18-Aug-2015 6:09 AM

## 2016-05-25 NOTE — ED Triage Notes (Signed)
Patient arrived via EMS for weakness times 3 days.  Patient had dialysis Sat (she thinks) and is scheduled today (Tues/Thurs/Sat)  States she has been eating and drinking very little   Zofran 4mg  given for nausea

## 2016-05-25 NOTE — ED Notes (Addendum)
Dr Wilkie AyeHorton and Lafe GarinBarb J aware of troponin 22.46

## 2016-05-25 NOTE — ED Notes (Signed)
Pulse check - compressions restrarted

## 2016-05-25 NOTE — ED Notes (Signed)
Pulse check weak and thready  BP 68/24

## 2016-05-25 NOTE — ED Notes (Signed)
V tach noted - patient shocked at 120 back into wide complex rhythm.

## 2016-05-25 NOTE — ED Notes (Signed)
Patient unresponsive at times but will answer when spoken to.  Skin clammy to touch,  pale, lips dry

## 2016-05-25 NOTE — ED Notes (Addendum)
Pt's son Kathryn Wilson [phone] notified pt is in the ED. Mr. [name] states he will come to the ED.

## 2016-05-25 NOTE — ED Notes (Signed)
Pulse check and V tach noted shocked at 150  Wide complex and Amio 300mg  given IV push

## 2016-05-25 DEATH — deceased

## 2018-08-04 IMAGING — CR DG CHEST 1V PORT
1 series · 1 of 1 positions shown · non-contrast
Comparison: Chest radiograph dated 05/07/2016

CLINICAL DATA: 70-year-old female with central line placement.

EXAM:
PORTABLE CHEST 1 VIEW

[AP]
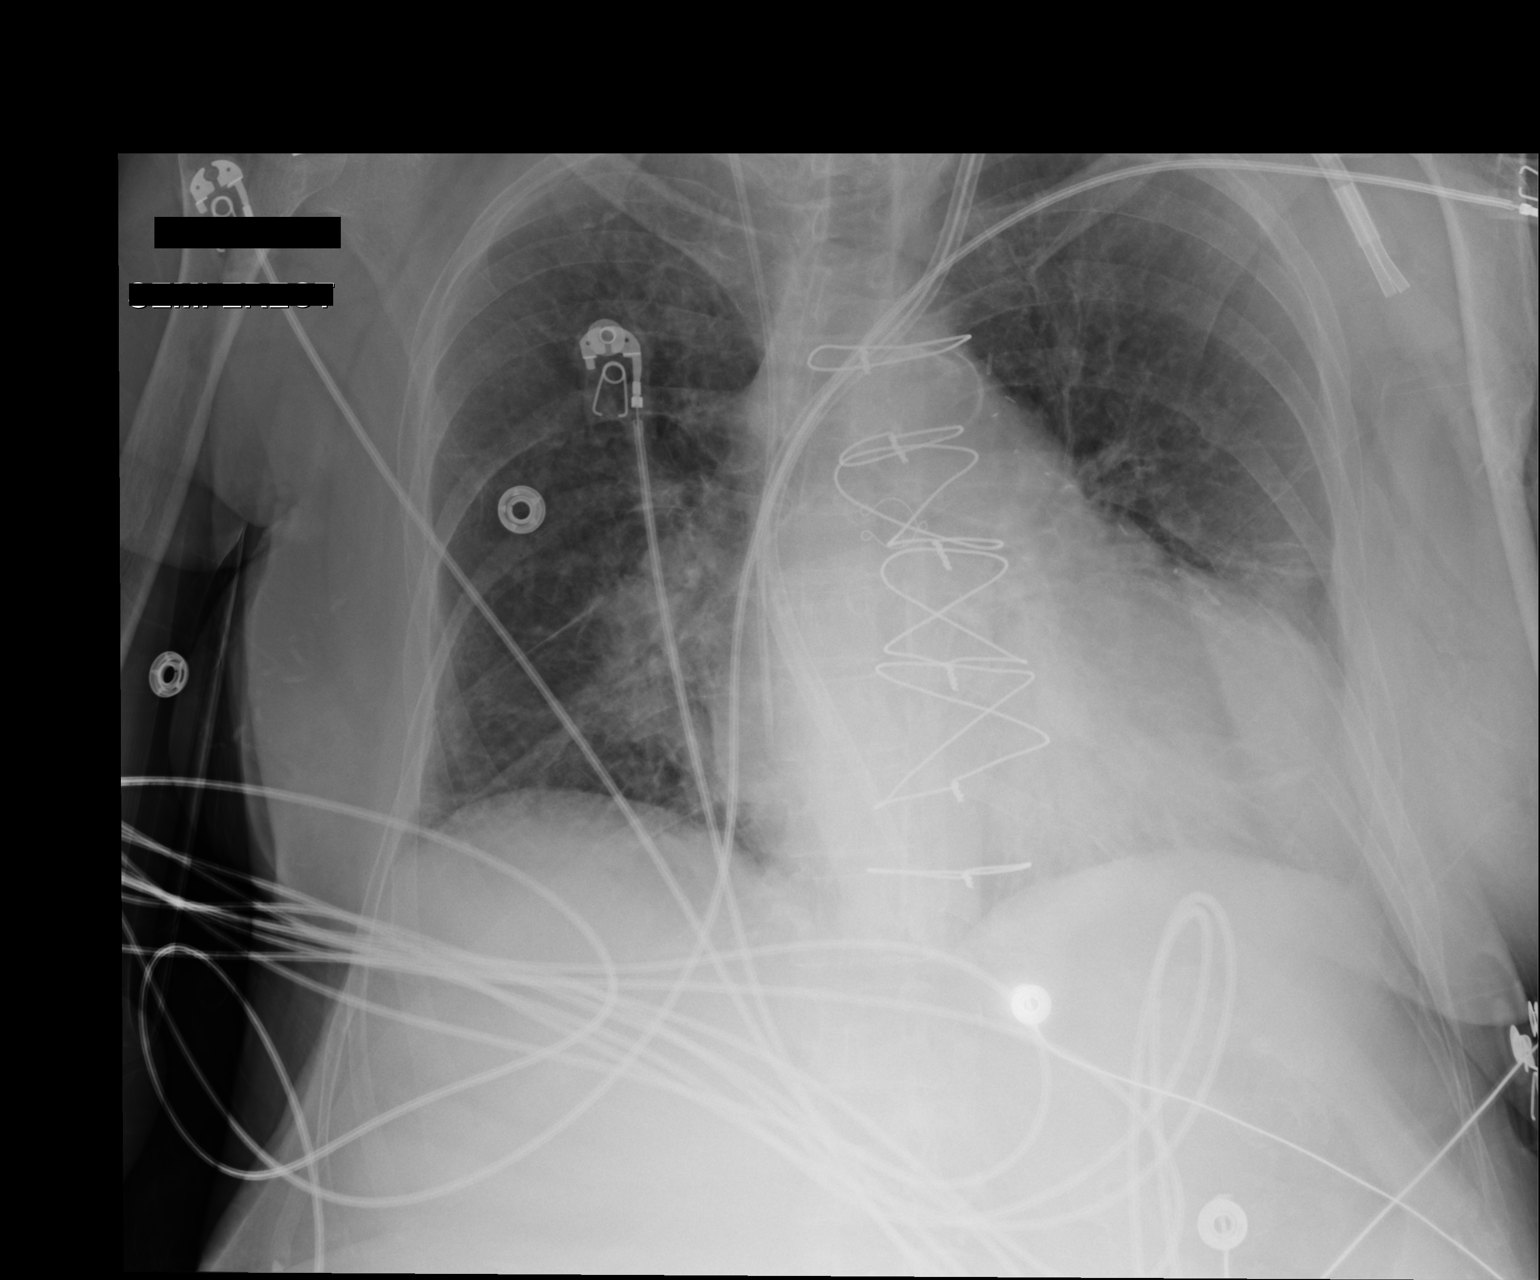

[1 of 1 positions shown; findings below may reference images not displayed]

FINDINGS: There has been interval placement of a right IJ central line with
tip over the right atrium. Left-sided dialysis catheter remains in
stable position. There is a patchy area opacity in the right
perihilar region with air bronchogram which may represent
atelectasis versus infiltrate. A hilar mass is not excluded.
Clinical correlation and follow-up to resolution is recommended.
Left lung base atelectatic changes versus less likely infiltrate.
Linear left mid lung field platelike atelectasis/scarring. There is
no pleural effusion or pneumothorax. There is moderate cardiomegaly.
Median sternotomy wires and CABG vascular clips noted. No acute
osseous pathology.
IMPRESSION: Interval placement of a right IJ central line with tip over right
atrium. No pneumothorax.

Right hilar opacity may represent atelectasis versus infiltrate. A
hilar mass is not excluded. Clinical correlation and follow-up to
resolution is recommended.

Left lung base atelectatic changes.
